# Patient Record
Sex: Male | Born: 1970 | Race: Asian | Hispanic: No | Marital: Married | Smoking: Never smoker
Health system: Southern US, Community
[De-identification: ages and names within clinical notes are randomized; demographics above are authoritative.]

## PROBLEM LIST (undated history)

## (undated) DIAGNOSIS — I10 Essential (primary) hypertension: Secondary | ICD-10-CM

## (undated) HISTORY — PX: NO PAST SURGERIES: SHX2092

---

## 2004-02-19 ENCOUNTER — Emergency Department (HOSPITAL_COMMUNITY): Admission: EM | Admit: 2004-02-19 | Discharge: 2004-02-19 | Payer: Self-pay | Admitting: Emergency Medicine

## 2005-05-22 ENCOUNTER — Emergency Department (HOSPITAL_COMMUNITY): Admission: EM | Admit: 2005-05-22 | Discharge: 2005-05-22 | Payer: Self-pay | Admitting: Ophthalmology

## 2010-12-21 ENCOUNTER — Ambulatory Visit (INDEPENDENT_AMBULATORY_CARE_PROVIDER_SITE_OTHER): Payer: BC Managed Care – PPO | Admitting: Medical

## 2010-12-21 ENCOUNTER — Encounter: Payer: Self-pay | Admitting: Medical

## 2010-12-21 DIAGNOSIS — Z7189 Other specified counseling: Secondary | ICD-10-CM

## 2010-12-21 DIAGNOSIS — Z0000 Encounter for general adult medical examination without abnormal findings: Secondary | ICD-10-CM

## 2010-12-21 MED ORDER — BENZONATATE 200 MG PO CAPS: 200.0000 mg | ORAL_CAPSULE | Freq: Three times a day (TID) | ORAL | Status: DC | PRN

## 2010-12-21 MED ORDER — AZITHROMYCIN 250 MG PO TABS: ORAL_TABLET | ORAL | Status: DC

## 2010-12-21 NOTE — Patient Instructions (Signed)
I examined you today.    I would recommend you eat healthy, exercise regularly and try and lose 10 pounds in the next 3 months.  You are a little overweight.  You stated today that your employer has a nurse that checks labs and checks your health regularly.  Please ask the nurse to get [place] a copy of your latest lab work.  If you have not had the following lab work in the past 6 months, then return here for labs.  We normally check the following labs during a physical:  Comprehensive metabolic panel           (CMET) Complete blood count                            (CBC) Lipid panel                                              (Lipid) Thyroid                                                   (TSH)  Your urinalysis was normal today.    Preventative Care for Adults, Male       REGULAR HEALTH EXAMS:  A routine yearly physical is a good way to check in with your primary care provider about your health and preventive screening. It is also an opportunity to share updates about your health and any concerns you have, and receive a thorough all-over exam.   Most health insurance companies pay for at least some preventative services.  Check with your health plan for specific coverages.  WHAT PREVENTATIVE SERVICES DO MEN NEED?  Adult men should have their weight and blood pressure checked regularly.   Men age 40 and older should have their cholesterol levels checked regularly.  Beginning at age 40 and continuing to age 40, men should be screened for colorectal cancer.  Certain people should may need continued testing until age 40.  Other cancer screening may include exams for testicular and prostate cancer.  Updating vaccinations is part of preventative care.  Vaccinations help protect against diseases such as the flu.  Lab tests are generally done as part of preventative care to screen for anemia and blood disorders, to screen for problems with the kidneys and liver, to screen for bladder  problems, to check blood sugar, and to check your cholesterol level.  Preventative services generally include counseling about diet, exercise, avoiding tobacco, drugs, excessive alcohol consumption, and sexually transmitted infections.    GENERAL RECOMMENDATIONS FOR GOOD HEALTH:  Healthy diet:  Eat a variety of foods, including fruit, vegetables, animal or vegetable protein, such as meat, fish, chicken, and eggs, or beans, lentils, tofu, and grains, such as rice.  Drink plenty of water daily.  Decrease saturated fat in the diet, avoid lots of red meat, processed foods, sweets, fast foods, and fried foods.  Exercise:  Aerobic exercise helps maintain good heart health. At least 30-40 minutes of moderate-intensity exercise is recommended. For example, a brisk walk that increases your heart rate and breathing. This should be done on most days of the week.   Find a type of exercise or a  variety of exercises that you enjoy so that it becomes a part of your daily life.  Examples are running, walking, swimming, water aerobics, and biking.  For motivation and support, explore group exercise such as aerobic class, spin class, Zumba, Yoga,or  martial arts, etc.    Set exercise goals for yourself, such as a certain weight goal, walk or run in a race such as a 5k walk/run.  Speak to your primary care provider about exercise goals.  Disease prevention:  If you smoke or chew tobacco, find out from your caregiver how to quit. It can literally save your life, no matter how long you have been a tobacco user. If you do not use tobacco, never begin.   Maintain a healthy diet and normal weight. Increased weight leads to problems with blood pressure and diabetes.   The Body Mass Index or BMI is a way of measuring how much of your body is fat. Having a BMI above 27 increases the risk of heart disease, diabetes, hypertension, stroke and other problems related to obesity. Your caregiver can help determine your  BMI and based on it develop an exercise and dietary program to help you achieve or maintain this important measurement at a healthful level.  High blood pressure causes heart and blood vessel problems.  Persistent high blood pressure should be treated with medicine if weight loss and exercise do not work.   Fat and cholesterol leaves deposits in your arteries that can block them. This causes heart disease and vessel disease elsewhere in your body.  If your cholesterol is found to be high, or if you have heart disease or certain other medical conditions, then you may need to have your cholesterol monitored frequently and be treated with medication.   Ask if you should have a stress test if your history suggests this. A stress test is a test done on a treadmill that looks for heart disease. This test can find disease prior to there being a problem.  Avoid drinking alcohol in excess (more than two drinks per day).  Avoid use of street drugs. Do not share needles with anyone. Ask for professional help if you need assistance or instructions on stopping the use of alcohol, cigarettes, and/or drugs.  Brush your teeth twice a day with fluoride toothpaste, and floss once a day. Good oral hygiene prevents tooth decay and gum disease. The problems can be painful, unattractive, and can cause other health problems. Visit your dentist for a routine oral and dental check up and preventive care every 6-12 months.   Look at your skin regularly.  Use a mirror to look at your back. Notify your caregivers of changes in moles, especially if there are changes in shapes, colors, a size larger than a pencil eraser, an irregular border, or development of new moles.  Safety:  Use seatbelts 100% of the time, whether driving or as a passenger.  Use safety devices such as hearing protection if you work in environments with loud noise or significant background noise.  Use safety glasses when doing any work that could send debris  in to the eyes.  Use a helmet if you ride a bike or motorcycle.  Use appropriate safety gear for contact sports.  Talk to your caregiver about gun safety.  Use sunscreen with a SPF (or skin protection factor) of 15 or greater.  Lighter skinned people are at a greater risk of skin cancer. Don't forget to also wear sunglasses in order to protect your  eyes from too much damaging sunlight. Damaging sunlight can accelerate cataract formation.   Practice safe sex. Use condoms. Condoms are used for birth control and to help reduce the spread of sexually transmitted infections (or STIs).  Some of the STIs are gonorrhea (the clap), chlamydia, syphilis, trichomonas, herpes, HPV (human papilloma virus) and HIV (human immunodeficiency virus) which causes AIDS. The herpes, HIV and HPV are viral illnesses that have no cure. These can result in disability, cancer and death.   Keep carbon monoxide and smoke detectors in your home functioning at all times. Change the batteries every 6 months or use a model that plugs into the wall.   Vaccinations:  Stay up to date with your tetanus shots and other required immunizations. You should have a booster for tetanus every 10 years. Be sure to get your flu shot every year, since 5%-20% of the [place] population comes down with the flu. The flu vaccine changes each year, so being vaccinated once is not enough. Get your shot in the fall, before the flu season peaks.   Other vaccines to consider:  Pneumococcal vaccine to protect against certain types of pneumonia.  This is normally recommended for adults age 89 or older.  However, adults younger than 40 years old with certain underlying conditions such as diabetes, heart or lung disease should also receive the vaccine.  Shingles vaccine to protect against Varicella Zoster if you are older than age 23, or younger than 40 years old with certain underlying illness.  Hepatitis A vaccine to protect against a form of infection of the  liver by a virus acquired from food.  Hepatitis B vaccine to protect against a form of infection of the liver by a virus acquired from blood or body fluids, particularly if you work in health care.  If you plan to travel internationally, check with your local health department for specific vaccination recommendations.  Cancer Screening:  Most routine colon cancer screening begins at the age of 51. On a yearly basis, doctors may provide special easy to use take-home tests to check for hidden blood in the stool. Sigmoidoscopy or colonoscopy can detect the earliest forms of colon cancer and is life saving. These tests use a small camera at the end of a tube to directly examine the colon. Speak to your caregiver about this at age 17, when routine screening begins (and is repeated every 5 years unless early forms of pre-cancerous polyps or small growths are found).   At the age of 86 men usually start screening for prostate cancer every year. Screening may begin at a younger age for those with higher risk. Those at higher risk include African-Americans or having a family history of prostate cancer. There are two types of tests for prostate cancer:   Prostate-specific antigen (PSA) testing. Recent studies raise questions about prostate cancer using PSA and you should discuss this with your caregiver.   Digital rectal exam (in which your doctor's lubricated and gloved finger feels for enlargement of the prostate through the anus).   Screening for testicular cancer.  Do a monthly exam of your testicles. Gently roll each testicle between your thumb and fingers, feeling for any abnormal lumps. The best time to do this is after a hot shower or bath when the tissues are looser. Notify your caregivers of any lumps, tenderness or changes in size or shape immediately.

## 2010-12-21 NOTE — Progress Notes (Signed)
Addended by: Jac Canavan on: 12/21/2010 07:00 PM   Modules accepted: Orders

## 2010-12-21 NOTE — Progress Notes (Signed)
Subjective:   HPI  Casey [name] is a 40 y.o. male who presents as a new patient for a complete physical.  His daughter is here with him to help translate.  He is originally from [place], but has lived here 17 years.  He speaks relatively good english.  He has not had a regular family doctor, and other than rare occasional acute issue, hasn't been to the doctor in years.     He is here because his employer requires all their employees to have a family physician and yearly physical.  He notes that the employer occupational nurse checks him regularly, presumably for BP, but he notes labs done through work routinely, within the past 8mo.   He is not fasting today and declines labs today.    In general he feels healthy without c/o.    Reviewed their medical, surgical, family, social, medication, and allergy history and updated chart as appropriate.  History reviewed. No pertinent past medical history.  History reviewed. No pertinent past surgical history.  History reviewed. No pertinent family history.  History   Social History  . Marital Status: Single    Spouse Name: N/A    Number of Children: N/A  . Years of Education: N/A   Occupational History  . Not on file.   Social History Main Topics  . Smoking status: Never Smoker   . Smokeless tobacco: Never Used  . Alcohol Use: No  . Drug Use: No  . 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 Not on file   Social History Narrative  . No narrative on file    No current outpatient prescriptions on file prior to visit.    No Known Allergies   Review of Systems Constitutional: denies fever, chills, sweats, unexpected weight change, anorexia, fatigue Allergy: negative; denies recent sneezing, itching, congestion Dermatology: denies changing moles, rash, lumps, new worrisome lesions ENT: no runny nose, ear pain, sore  throat, hoarseness, sinus pain, teeth pain, tinnitus, hearing loss, epistaxis Cardiology: denies chest pain, palpitations, edema, orthopnea, paroxysmal nocturnal dyspnea Respiratory: denies cough, shortness of breath, dyspnea on exertion, wheezing, hemoptysis Gastroenterology: denies abdominal pain, nausea, vomiting, diarrhea, constipation, blood in stool, changes in bowel movement, dysphagia Hematology: denies bleeding or bruising problems Musculoskeletal: denies arthralgias, myalgias, joint swelling, back pain, neck pain, cramping, gait changes Ophthalmology: denies vision changes, eye redness, itching, discharge Urology: denies dysuria, difficulty urinating, hematuria, urinary frequency, urgency, incontinence Neurology: +occasional headache; denies weakness, tingling, numbness, speech abnormality, memory loss, falls, dizziness Psychology: denies depressed mood, agitation, sleep problems     Objective:   Physical Exam  Filed Vitals:   12/21/10 1452  BP: 120/80  Pulse: 80  Temp: 97.7 F (36.5 C)  Resp: 20    General appearance: alert, no distress, WD/WN, [place] male, overweight Skin: unremarkable, no worrisome lesions HEENT: normocephalic, conjunctiva/corneas normal, sclerae anicteric, PERRLA, EOMi, nares patent, no discharge or erythema, pharynx normal Oral cavity: MMM, tongue normal, teeth with lots of stain, but no obvious decay, relatively good repair Neck: supple, no lymphadenopathy, no thyromegaly, no masses, normal ROM, no bruits Chest: non tender, normal shape and expansion Heart: RRR, normal S1, S2, no murmurs Lungs: CTA bilaterally, no wheezes, rhonchi, or rales Abdomen: +bs, soft, non tender, non distended, no masses, no hepatomegaly, no splenomegaly, no bruits Back: non tender, normal ROM, no scoliosis Musculoskeletal: upper extremities  non tender, no obvious deformity, normal ROM throughout, lower extremities non tender, no obvious deformity, normal ROM  throughout Extremities: no edema, no cyanosis, no clubbing Pulses: 2+ symmetric, upper and lower extremities, normal cap refill Neurological: alert, oriented x 3, CN2-12 intact, strength normal upper extremities and lower extremities, sensation normal throughout, DTRs 2+ throughout, no cerebellar signs, gait normal Psychiatric: normal affect, behavior normal, pleasant  GU: normal male external genitalia, uncircumcised, no mass, no hernia, nontender   Assessment :    Encounter Diagnoses  Name Primary?  . General medical examination Yes  . Counseling on health promotion and disease prevention       Plan:    Physical exam - discussed healthy lifestyle, diet, exercise, preventative care, vaccinations, and addressed their concerns.    Advised screening labs, flu shot, Tdap update.  He declines as he thinks these have all been done through work.  I advised weight loss, healthy diet.  Advised he bring me copies of labs and vaccines through employer.  I signed his form, but advised that a physical includes screening labs as well which were not done today.

## 2010-12-21 NOTE — Progress Notes (Signed)
Addended by: Jac Canavan on: 12/21/2010 06:58 PM   Modules accepted: Orders

## 2010-12-25 LAB — POCT URINALYSIS DIPSTICK
Blood, UA: NEGATIVE
Protein, UA: NEGATIVE
Spec Grav, UA: 1.01
Urobilinogen, UA: NEGATIVE

## 2011-01-10 ENCOUNTER — Telehealth: Payer: Self-pay | Admitting: Medical

## 2011-01-10 NOTE — Telephone Encounter (Signed)
Message copied by Janeice Robinson on Thu Jan 10, 2011  2:49 PM ------      Message from: Aleen Campi, DAVID S      Created: Thu Jan 10, 2011  2:09 PM       I reviewed labs that were sent over.   His triglycerides (fats) in the blood were high, but otherwise ALL labs ok.  His blood counts, liver, kidney, lytes such as sodium and potassium, thyroid, bad cholesterol ALL normal.               Regarding his fats, I recommend he exercise daily such as walking, eat healthy low fat diet, cut down on total calorie intake, and try and lose 10-15 pounds in the next 102mo.  In general - avoid soda, sweet tea, limit sweets (cakes, pies, ice cream), and avoid fast food.             I also want him to see a dentist for a hygiene visit.  I recommend Dr. Yancey Flemings [phone].  He can call them for pricing, etc.             Lets see him back in 6 months to recheck on his weight, diet.

## 2011-01-10 NOTE — Telephone Encounter (Signed)
I spoke with the patients daughter (because patient does not speak english) and notified her of S. Tysingers message and advised her of the name of the dentist and to f/u in 6 months. cls

## 2012-01-15 ENCOUNTER — Encounter: Payer: BC Managed Care – PPO | Admitting: Medical

## 2012-01-31 ENCOUNTER — Ambulatory Visit (INDEPENDENT_AMBULATORY_CARE_PROVIDER_SITE_OTHER): Payer: BC Managed Care – PPO | Admitting: Medical

## 2012-01-31 ENCOUNTER — Encounter: Payer: Self-pay | Admitting: Medical

## 2012-01-31 VITALS — BP 112/80 | HR 88 | Temp 97.5°F | Resp 16 | Ht 64.5 in | Wt 196.0 lb

## 2012-01-31 DIAGNOSIS — Z0000 Encounter for general adult medical examination without abnormal findings: Secondary | ICD-10-CM

## 2012-01-31 LAB — CBC WITH DIFFERENTIAL/PLATELET
Eosinophils Absolute: 0.2 10*3/uL (ref 0.0–0.7)
Hemoglobin: 16.8 g/dL (ref 13.0–17.0)
MCH: 29 pg (ref 26.0–34.0)
MCHC: 34.5 g/dL (ref 30.0–36.0)
Neutro Abs: 4 10*3/uL (ref 1.7–7.7)
Neutrophils Relative %: 56 % (ref 43–77)
Platelets: 174 10*3/uL (ref 150–400)

## 2012-01-31 NOTE — Progress Notes (Signed)
Subjective:   HPI  Casey [name] is a 41 y.o. male who presents for a yearly physical.   Last visit here a year ago for the same.  He is originally from [place], but has lived here 17 years.  He speaks relatively good english.  He is here because his employer requires all their employees to have a family physician and yearly physical.  He notes that the employer occupational nurse checks him regularly, presumably for BP.  In general he feels healthy without c/o.    Reviewed their medical, surgical, family, social, medication, and allergy history and updated chart as appropriate.  No past medical history on file.  No past surgical history on file.  No family history on file.  History   Social History  . Marital Status: Single    Spouse Name: N/A    Number of Children: N/A  . Years of Education: N/A   Occupational History  . Not on file.   Social History Main Topics  . Smoking status: Never Smoker   . Smokeless tobacco: Never Used  . Alcohol Use: No  . Drug Use: No  . 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 Not on file   Social History Narrative  . No narrative on file    No current outpatient prescriptions on file prior to visit.    No Known Allergies  Review of Systems Constitutional: -fever, -chills, -sweats, -unexpected weight change, -anorexia, -fatigue Allergy: -sneezing, -itching, -congestion Dermatology: denies changing moles, rash, lumps, new worrisome lesions ENT: -runny nose, +ear pain, -sore throat, -hoarseness, -sinus pain, -teeth pain, -tinnitus, -hearing loss, -epistaxis Cardiology:  -chest pain, -palpitations, -edema, -orthopnea, -paroxysmal nocturnal dyspnea Respiratory: -cough, -shortness of breath, -dyspnea on exertion, -wheezing, -hemoptysis Gastroenterology: -abdominal pain, -nausea, -vomiting, -diarrhea, -constipation, -blood in  stool, -changes in bowel movement, -dysphagia Hematology: -bleeding or bruising problems Musculoskeletal: -arthralgias, -myalgias, -joint swelling, -back pain, -neck pain, -cramping, -gait changes Ophthalmology: -vision changes, -eye redness, -itching, -discharge Urology: -dysuria, -difficulty urinating, -hematuria, -urinary frequency, -urgency, incontinence Neurology: +headache, -weakness, -tingling, -numbness, -speech abnormality, -memory loss, -falls, -dizziness Psychology:  -depressed mood, -agitation, -sleep problems         Objective:   Physical Exam  Filed Vitals:   01/31/12 1021  BP: 112/80  Pulse: 88  Temp: 97.5 F (36.4 C)  Resp: 16    General appearance: alert, no distress, WD/WN, [place] male, overweight Skin: unremarkable, no worrisome lesions HEENT: normocephalic, conjunctiva/corneas normal, sclerae anicteric, PERRLA, EOMi, nares patent, no discharge or erythema, pharynx normal Oral cavity: MMM, tongue normal, teeth with lots of stain, but no obvious decay, relatively good repair Neck: supple, no lymphadenopathy, no thyromegaly, no masses, normal ROM, no bruits Chest: non tender, normal shape and expansion Heart: RRR, normal S1, S2, no murmurs Lungs: CTA bilaterally, no wheezes, rhonchi, or rales Abdomen: +bs, soft, non tender, non distended, no masses, no hepatomegaly, no splenomegaly, no bruits Back: non tender, normal ROM, no scoliosis Musculoskeletal: upper extremities non tender, no obvious deformity, normal ROM throughout, lower extremities non tender, no obvious deformity, normal ROM throughout Extremities: no edema, no cyanosis, no clubbing Pulses: 2+ symmetric, upper and lower extremities, normal cap refill Neurological: alert, oriented x 3, CN2-12 intact, strength normal upper extremities and lower extremities, sensation normal throughout, DTRs 2+ throughout, no cerebellar signs, gait normal Psychiatric: normal affect, behavior normal, pleasant  GU:  normal male external genitalia, uncircumcised, no mass, no hernia, nontender   Assessment :    Encounter Diagnosis  Name Primary?  . Routine general medical examination at a health care facility Yes     Plan:    Physical exam - discussed healthy lifestyle, diet, exercise, preventative care, vaccinations, and addressed their concerns.  He recently received flu shot at work. Fasting labs today.  Physical form for work signed.

## 2012-01-31 NOTE — Patient Instructions (Signed)
Preventative Care for Adults, Male       REGULAR HEALTH EXAMS:  A routine yearly physical is a good way to check in with your primary care provider about your health and preventive screening. It is also an opportunity to share updates about your health and any concerns you have, and receive a thorough all-over exam.   Most health insurance companies pay for at least some preventative services.  Check with your health plan for specific coverages.  WHAT PREVENTATIVE SERVICES DO MEN NEED?  Adult men should have their weight and blood pressure checked regularly.   Men age 35 and older should have their cholesterol levels checked regularly.  Beginning at age 50 and continuing to age 75, men should be screened for colorectal cancer.  Certain people should may need continued testing until age 85.  Other cancer screening may include exams for testicular and prostate cancer.  Updating vaccinations is part of preventative care.  Vaccinations help protect against diseases such as the flu.  Lab tests are generally done as part of preventative care to screen for anemia and blood disorders, to screen for problems with the kidneys and liver, to screen for bladder problems, to check blood sugar, and to check your cholesterol level.  Preventative services generally include counseling about diet, exercise, avoiding tobacco, drugs, excessive alcohol consumption, and sexually transmitted infections.    GENERAL RECOMMENDATIONS FOR GOOD HEALTH:  Healthy diet:  Eat a variety of foods, including fruit, vegetables, animal or vegetable protein, such as meat, fish, chicken, and eggs, or beans, lentils, tofu, and grains, such as rice.  Drink plenty of water daily.  Decrease saturated fat in the diet, avoid lots of red meat, processed foods, sweets, fast foods, and fried foods.  Exercise:  Aerobic exercise helps maintain good heart health. At least 30-40 minutes of moderate-intensity exercise is recommended.  For example, a brisk walk that increases your heart rate and breathing. This should be done on most days of the week.   Find a type of exercise or a variety of exercises that you enjoy so that it becomes a part of your daily life.  Examples are running, walking, swimming, water aerobics, and biking.  For motivation and support, explore group exercise such as aerobic class, spin class, Zumba, Yoga,or  martial arts, etc.    Set exercise goals for yourself, such as a certain weight goal, walk or run in a race such as a 5k walk/run.  Speak to your primary care provider about exercise goals.  Disease prevention:  If you smoke or chew tobacco, find out from your caregiver how to quit. It can literally save your life, no matter how long you have been a tobacco user. If you do not use tobacco, never begin.   Maintain a healthy diet and normal weight. Increased weight leads to problems with blood pressure and diabetes.   The Body Mass Index or BMI is a way of measuring how much of your body is fat. Having a BMI above 27 increases the risk of heart disease, diabetes, hypertension, stroke and other problems related to obesity. Your caregiver can help determine your BMI and based on it develop an exercise and dietary program to help you achieve or maintain this important measurement at a healthful level.  High blood pressure causes heart and blood vessel problems.  Persistent high blood pressure should be treated with medicine if weight loss and exercise do not work.   Fat and cholesterol leaves deposits in your arteries   that can block them. This causes heart disease and vessel disease elsewhere in your body.  If your cholesterol is found to be high, or if you have heart disease or certain other medical conditions, then you may need to have your cholesterol monitored frequently and be treated with medication.   Ask if you should have a stress test if your history suggests this. A stress test is a test done on  a treadmill that looks for heart disease. This test can find disease prior to there being a problem.  Avoid drinking alcohol in excess (more than two drinks per day).  Avoid use of street drugs. Do not share needles with anyone. Ask for professional help if you need assistance or instructions on stopping the use of alcohol, cigarettes, and/or drugs.  Brush your teeth twice a day with fluoride toothpaste, and floss once a day. Good oral hygiene prevents tooth decay and gum disease. The problems can be painful, unattractive, and can cause other health problems. Visit your dentist for a routine oral and dental check up and preventive care every 6-12 months.   Look at your skin regularly.  Use a mirror to look at your back. Notify your caregivers of changes in moles, especially if there are changes in shapes, colors, a size larger than a pencil eraser, an irregular border, or development of new moles.  Safety:  Use seatbelts 100% of the time, whether driving or as a passenger.  Use safety devices such as hearing protection if you work in environments with loud noise or significant background noise.  Use safety glasses when doing any work that could send debris in to the eyes.  Use a helmet if you ride a bike or motorcycle.  Use appropriate safety gear for contact sports.  Talk to your caregiver about gun safety.  Use sunscreen with a SPF (or skin protection factor) of 15 or greater.  Lighter skinned people are at a greater risk of skin cancer. Don't forget to also wear sunglasses in order to protect your eyes from too much damaging sunlight. Damaging sunlight can accelerate cataract formation.   Practice safe sex. Use condoms. Condoms are used for birth control and to help reduce the spread of sexually transmitted infections (or STIs).  Some of the STIs are gonorrhea (the clap), chlamydia, syphilis, trichomonas, herpes, HPV (human papilloma virus) and HIV (human immunodeficiency virus) which causes AIDS.  The herpes, HIV and HPV are viral illnesses that have no cure. These can result in disability, cancer and death.   Keep carbon monoxide and smoke detectors in your home functioning at all times. Change the batteries every 6 months or use a model that plugs into the wall.   Vaccinations:  Stay up to date with your tetanus shots and other required immunizations. You should have a booster for tetanus every 10 years. Be sure to get your flu shot every year, since 5%-20% of the [place] population comes down with the flu. The flu vaccine changes each year, so being vaccinated once is not enough. Get your shot in the fall, before the flu season peaks.   Other vaccines to consider:  Pneumococcal vaccine to protect against certain types of pneumonia.  This is normally recommended for adults age 65 or older.  However, adults younger than 41 years old with certain underlying conditions such as diabetes, heart or lung disease should also receive the vaccine.  Shingles vaccine to protect against Varicella Zoster if you are older than age 60, or younger   than 41 years old with certain underlying illness.  Hepatitis A vaccine to protect against a form of infection of the liver by a virus acquired from food.  Hepatitis B vaccine to protect against a form of infection of the liver by a virus acquired from blood or body fluids, particularly if you work in health care.  If you plan to travel internationally, check with your local health department for specific vaccination recommendations.  Cancer Screening:  Most routine colon cancer screening begins at the age of 50. On a yearly basis, doctors may provide special easy to use take-home tests to check for hidden blood in the stool. Sigmoidoscopy or colonoscopy can detect the earliest forms of colon cancer and is life saving. These tests use a small camera at the end of a tube to directly examine the colon. Speak to your caregiver about this at age 50, when routine  screening begins (and is repeated every 5 years unless early forms of pre-cancerous polyps or small growths are found).   At the age of 50 men usually start screening for prostate cancer every year. Screening may begin at a younger age for those with higher risk. Those at higher risk include African-Americans or having a family history of prostate cancer. There are two types of tests for prostate cancer:   Prostate-specific antigen (PSA) testing. Recent studies raise questions about prostate cancer using PSA and you should discuss this with your caregiver.   Digital rectal exam (in which your doctor's lubricated and gloved finger feels for enlargement of the prostate through the anus).   Screening for testicular cancer.  Do a monthly exam of your testicles. Gently roll each testicle between your thumb and fingers, feeling for any abnormal lumps. The best time to do this is after a hot shower or bath when the tissues are looser. Notify your caregivers of any lumps, tenderness or changes in size or shape immediately.     

## 2012-02-01 LAB — COMPREHENSIVE METABOLIC PANEL
ALT: 32 U/L (ref 0–53)
AST: 22 U/L (ref 0–37)
Calcium: 9.5 mg/dL (ref 8.4–10.5)
Chloride: 100 mEq/L (ref 96–112)
Creat: 0.93 mg/dL (ref 0.50–1.35)
Total Bilirubin: 0.9 mg/dL (ref 0.3–1.2)

## 2012-02-01 LAB — LIPID PANEL
Cholesterol: 197 mg/dL (ref 0–200)
HDL: 37 mg/dL — ABNORMAL LOW (ref >39)
Total CHOL/HDL Ratio: 5.3 Ratio

## 2013-02-08 ENCOUNTER — Encounter (HOSPITAL_COMMUNITY): Payer: Self-pay | Admitting: Emergency Medicine

## 2013-02-08 ENCOUNTER — Emergency Department (HOSPITAL_COMMUNITY)
Admission: EM | Admit: 2013-02-08 | Discharge: 2013-02-08 | Disposition: A | Discharge status: Home / Self Care | Payer: BC Managed Care – PPO | Source: Home / Self Care | Attending: Family Medicine | Admitting: Family Medicine

## 2013-02-08 DIAGNOSIS — K649 Unspecified hemorrhoids: Secondary | ICD-10-CM

## 2013-02-08 LAB — POCT I-STAT, CHEM 8
Chloride: 103 mEq/L (ref 96–112)
Creatinine, Ser: 1.2 mg/dL (ref 0.50–1.35)
Glucose, Bld: 112 mg/dL — ABNORMAL HIGH (ref 70–99)
Hemoglobin: 17.3 g/dL — ABNORMAL HIGH (ref 13.0–17.0)

## 2013-02-08 LAB — RPR: RPR Ser Ql: NONREACTIVE

## 2013-02-08 MED ORDER — HYDROCORTISONE 2.5 % RE CREA: TOPICAL_CREAM | RECTAL | Status: DC

## 2013-02-08 NOTE — ED Provider Notes (Signed)
CSN: [id number]     Arrival date & time 02/08/13  1126 History   First MD Initiated Contact with Patient 02/08/13 1308     Chief Complaint  Patient presents with  . Rectal Bleeding   (Consider location/radiation/quality/duration/timing/severity/associated sxs/prior Treatment) Patient is a 42 y.o. male presenting with hematochezia. The history is provided by the patient. No language interpreter was used.  Rectal Bleeding Quality:  Bright red Timing:  Constant Progression:  Worsening Context: hemorrhoids and rectal pain   Pain details:    Severity:  Mild   Timing:  Constant   Progression:  Worsening Similar prior episodes: no   Relieved by:  Nothing Pt also request a syphillus test.  Pt reports exposure 2 years ago  History reviewed. No pertinent past medical history. History reviewed. No pertinent past surgical history. History reviewed. No pertinent family history. History  Substance Use Topics  . Smoking status: Never Smoker   . Smokeless tobacco: Never Used  . Alcohol Use: No    Review of Systems  Gastrointestinal: Positive for hematochezia.    Allergies  Review of patient's allergies indicates no known allergies.  Home Medications  No current outpatient prescriptions on file. BP 121/82  Pulse 71  Temp(Src) 98.5 F (36.9 C) (Oral)  Resp 16  SpO2 97% Physical Exam  Constitutional: He is oriented to person, place, and time. He appears well-developed and well-nourished.  HENT:  Head: Normocephalic.  Eyes: Conjunctivae are normal. Pupils are equal, round, and reactive to light.  Neck: Normal range of motion.  Cardiovascular: Normal rate and regular rhythm.   Pulmonary/Chest: Effort normal and breath sounds normal.  Abdominal: Soft.  Musculoskeletal: Normal range of motion.  Neurological: He is alert and oriented to person, place, and time. He has normal reflexes.  Skin: Skin is warm.  Psychiatric: He has a normal mood and affect.    ED Course  Procedures  (including critical care time) Labs Review Labs Reviewed  POCT I-STAT, CHEM 8 - Abnormal; Notable for the following:    Glucose, Bld 112 (*)    Hemoglobin 17.3 (*)    All other components within normal limits  RPR   Imaging Review No results found.  EKG Interpretation     Ventricular Rate:    PR Interval:    QRS Duration:   QT Interval:    QTC Calculation:   R Axis:     Text Interpretation:              MDM   1. Hemorrhoids    RPR pending      Elson Areas, PA-C 02/08/13 1422  Lonia Skinner [place], PA-C 02/08/13 1423  Lonia Skinner [place], [place] 02/08/13 1424

## 2013-02-08 NOTE — ED Notes (Signed)
C/o rectal bleeding Saturday and again today , both times w a firm /hard stool; information via daughter, who is acting as Nurse, learning disability; pt also using motrin for HA and "hot sensation " in back

## 2013-02-10 NOTE — ED Provider Notes (Signed)
Medical screening examination/treatment/procedure(s) were performed by resident physician or non-physician practitioner and as supervising physician I was immediately available for consultation/collaboration.   Dysen Edmondson DOUGLAS MD.   Khary Schaben D Jakobi [place], MD 02/10/13 2102 

## 2014-09-23 ENCOUNTER — Emergency Department (HOSPITAL_COMMUNITY): Payer: 59

## 2014-09-23 ENCOUNTER — Emergency Department (HOSPITAL_COMMUNITY)
Admission: EM | Admit: 2014-09-23 | Discharge: 2014-09-23 | Disposition: A | Discharge status: Home / Self Care | Payer: 59 | Attending: Emergency Medicine | Admitting: Emergency Medicine

## 2014-09-23 ENCOUNTER — Encounter (HOSPITAL_COMMUNITY): Payer: Self-pay | Admitting: Emergency Medicine

## 2014-09-23 ENCOUNTER — Other Ambulatory Visit: Payer: Self-pay

## 2014-09-23 DIAGNOSIS — R51 Headache: Secondary | ICD-10-CM | POA: Insufficient documentation

## 2014-09-23 DIAGNOSIS — R42 Dizziness and giddiness: Secondary | ICD-10-CM | POA: Diagnosis not present

## 2014-09-23 DIAGNOSIS — Z7952 Long term (current) use of systemic steroids: Secondary | ICD-10-CM | POA: Diagnosis not present

## 2014-09-23 DIAGNOSIS — I10 Essential (primary) hypertension: Secondary | ICD-10-CM | POA: Diagnosis not present

## 2014-09-23 DIAGNOSIS — R200 Anesthesia of skin: Secondary | ICD-10-CM | POA: Diagnosis not present

## 2014-09-23 DIAGNOSIS — R519 Headache, unspecified: Secondary | ICD-10-CM

## 2014-09-23 DIAGNOSIS — R112 Nausea with vomiting, unspecified: Secondary | ICD-10-CM | POA: Diagnosis not present

## 2014-09-23 DIAGNOSIS — Z79899 Other long term (current) drug therapy: Secondary | ICD-10-CM | POA: Diagnosis not present

## 2014-09-23 HISTORY — DX: Essential (primary) hypertension: I10

## 2014-09-23 LAB — I-STAT CHEM 8, ED
BUN: 13 mg/dL (ref 6–20)
CALCIUM ION: 1.19 mmol/L (ref 1.12–1.23)
Chloride: 103 mmol/L (ref 101–111)
Creatinine, Ser: 1.1 mg/dL (ref 0.61–1.24)
Glucose, Bld: 108 mg/dL — ABNORMAL HIGH (ref 65–99)
HCT: 48 % (ref 39.0–52.0)
Hemoglobin: 16.3 g/dL (ref 13.0–17.0)
Potassium: 3.3 mmol/L — ABNORMAL LOW (ref 3.5–5.1)
Sodium: 140 mmol/L (ref 135–145)
TCO2: 23 mmol/L (ref 0–100)

## 2014-09-23 LAB — URINALYSIS, ROUTINE W REFLEX MICROSCOPIC
Bilirubin Urine: NEGATIVE
Glucose, UA: NEGATIVE mg/dL
HGB URINE DIPSTICK: NEGATIVE
Ketones, ur: NEGATIVE mg/dL
LEUKOCYTES UA: NEGATIVE
NITRITE: NEGATIVE
PH: 6.5 (ref 5.0–8.0)
Protein, ur: NEGATIVE mg/dL
Specific Gravity, Urine: 1.022 (ref 1.005–1.030)
UROBILINOGEN UA: 1 mg/dL (ref 0.0–1.0)

## 2014-09-23 LAB — CBC
HEMATOCRIT: 47.9 % (ref 39.0–52.0)
HEMOGLOBIN: 16.4 g/dL (ref 13.0–17.0)
MCH: 29.2 pg (ref 26.0–34.0)
MCHC: 34.2 g/dL (ref 30.0–36.0)
MCV: 85.2 fL (ref 78.0–100.0)
Platelets: 143 10*3/uL — ABNORMAL LOW (ref 150–400)
RBC: 5.62 MIL/uL (ref 4.22–5.81)
RDW: 13.3 % (ref 11.5–15.5)
WBC: 7.4 10*3/uL (ref 4.0–10.5)

## 2014-09-23 LAB — DIFFERENTIAL
BASOS ABS: 0 10*3/uL (ref 0.0–0.1)
BASOS PCT: 0 % (ref 0–1)
Eosinophils Absolute: 0.2 10*3/uL (ref 0.0–0.7)
Eosinophils Relative: 2 % (ref 0–5)
Lymphocytes Relative: 44 % (ref 12–46)
Lymphs Abs: 3.3 10*3/uL (ref 0.7–4.0)
Monocytes Absolute: 0.7 10*3/uL (ref 0.1–1.0)
Monocytes Relative: 10 % (ref 3–12)
Neutro Abs: 3.2 10*3/uL (ref 1.7–7.7)
Neutrophils Relative %: 44 % (ref 43–77)

## 2014-09-23 LAB — COMPREHENSIVE METABOLIC PANEL
ALT: 44 U/L (ref 17–63)
AST: 29 U/L (ref 15–41)
Albumin: 3.9 g/dL (ref 3.5–5.0)
Alkaline Phosphatase: 68 U/L (ref 38–126)
Anion gap: 9 (ref 5–15)
BUN: 11 mg/dL (ref 6–20)
CALCIUM: 8.9 mg/dL (ref 8.9–10.3)
CO2: 26 mmol/L (ref 22–32)
CREATININE: 1.02 mg/dL (ref 0.61–1.24)
Chloride: 103 mmol/L (ref 101–111)
GLUCOSE: 107 mg/dL — AB (ref 65–99)
Potassium: 3.3 mmol/L — ABNORMAL LOW (ref 3.5–5.1)
SODIUM: 138 mmol/L (ref 135–145)
TOTAL PROTEIN: 6.9 g/dL (ref 6.5–8.1)
Total Bilirubin: 0.6 mg/dL (ref 0.3–1.2)

## 2014-09-23 LAB — RAPID URINE DRUG SCREEN, HOSP PERFORMED
AMPHETAMINES: NOT DETECTED
Barbiturates: NOT DETECTED
Benzodiazepines: NOT DETECTED
Cocaine: NOT DETECTED
OPIATES: NOT DETECTED
Tetrahydrocannabinol: NOT DETECTED

## 2014-09-23 LAB — ETHANOL: Alcohol, Ethyl (B): <5 mg/dL (ref <5)

## 2014-09-23 LAB — PROTIME-INR
INR: 1.01 (ref 0.00–1.49)
Prothrombin Time: 13.5 seconds (ref 11.6–15.2)

## 2014-09-23 LAB — I-STAT TROPONIN, ED: Troponin i, poc: 0.01 ng/mL (ref 0.00–0.08)

## 2014-09-23 LAB — APTT: aPTT: 30 seconds (ref 24–37)

## 2014-09-23 MED ORDER — ONDANSETRON 4 MG PO TBDP: ORAL_TABLET | ORAL | Status: AC

## 2014-09-23 MED ORDER — MECLIZINE HCL 25 MG PO TABS
50.0000 mg | ORAL_TABLET | Freq: Once | ORAL | Status: AC
  Administered 2014-09-23: 50 mg via ORAL
  Filled 2014-09-23: qty 2

## 2014-09-23 MED ORDER — HYDROCODONE-ACETAMINOPHEN 5-325 MG PO TABS: 2.0000 | ORAL_TABLET | ORAL | Status: AC | PRN

## 2014-09-23 MED ORDER — MECLIZINE HCL 25 MG PO TABS: 25.0000 mg | ORAL_TABLET | Freq: Three times a day (TID) | ORAL | Status: AC | PRN

## 2014-09-23 MED ORDER — FENTANYL CITRATE (PF) 100 MCG/2ML IJ SOLN
50.0000 ug | Freq: Once | INTRAMUSCULAR | Status: AC
  Administered 2014-09-23: 50 ug via INTRAVENOUS
  Filled 2014-09-23: qty 2

## 2014-09-23 MED ORDER — ONDANSETRON HCL 4 MG/2ML IJ SOLN
4.0000 mg | Freq: Once | INTRAMUSCULAR | Status: AC
  Administered 2014-09-23: 4 mg via INTRAVENOUS
  Filled 2014-09-23: qty 2

## 2014-09-23 NOTE — ED Notes (Signed)
Per pt's family member, pt woke up this morning around 0300 with sudden onset of headache on the left side, dizziness and vomiting. Pt is also reporting decreased hearing and ringing in his left ear.

## 2014-09-23 NOTE — ED Notes (Signed)
Pt states that he does not want anything for pain.

## 2014-09-23 NOTE — ED Notes (Signed)
Pt ambulated in the hallway with ease. 

## 2014-09-23 NOTE — ED Provider Notes (Signed)
CSN: [id number]     Arrival date & time 09/23/14  4098 History   First MD Initiated Contact with Patient 09/23/14 0450     Chief Complaint  Patient presents with  . Headache  . Dizziness     (Consider location/radiation/quality/duration/timing/severity/associated sxs/prior Treatment) HPI Patient presents with acute onset headache at 3 AM waking him from sleep. He noticed decreased sensation to the left upper and lower extremity. He also experienced decreased hearing and ringing in his left ear. He's had dizziness with nausea and vomiting. Per daughter patient had similar episode several weeks ago. Past Medical History  Diagnosis Date  . Hypertension    History reviewed. No pertinent past surgical history. History reviewed. No pertinent family history. History  Substance Use Topics  . Smoking status: Never Smoker   . Smokeless tobacco: Never Used  . Alcohol Use: No    Review of Systems  Constitutional: Negative for fever and chills.  Eyes: Negative for visual disturbance.  Respiratory: Negative for shortness of breath.   Cardiovascular: Negative for chest pain.  Gastrointestinal: Positive for nausea and vomiting. Negative for abdominal pain.  Musculoskeletal: Negative for back pain, neck pain and neck stiffness.  Skin: Negative for rash and wound.  Neurological: Positive for dizziness, light-headedness, numbness and headaches. Negative for syncope and weakness.  All other systems reviewed and are negative.     Allergies  Shellfish allergy  Home Medications   Prior to Admission medications   Medication Sig Start Date End Date Taking? Authorizing Provider  HYDROcodone-acetaminophen (NORCO) 5-325 MG per tablet Take 2 tablets by mouth every 4 (four) hours as needed. 09/23/14   Loren Racer, MD  hydrocortisone (ANUSOL-HC) 2.5 % rectal cream Apply rectally 2 times daily 02/08/13   Elson Areas, PA-C  meclizine (ANTIVERT) 25 MG tablet Take 1 tablet (25 mg total) by mouth 3  (three) times daily as needed for dizziness or nausea. 09/23/14   Loren Racer, MD  ondansetron (ZOFRAN ODT) 4 MG disintegrating tablet  ODT q4 hours prn nausea/vomit 09/23/14   Loren Racer, MD   BP 121/83 mmHg  Pulse 67  Temp(Src) 97.8 F (36.6 C) (Oral)  Resp 15  SpO2 97% Physical Exam  Constitutional: He is oriented to person, place, and time. He appears well-developed and well-nourished. No distress.  HENT:  Head: Normocephalic and atraumatic.  Mouth/Throat: Oropharynx is clear and moist. No oropharyngeal exudate.  Eyes: EOM are normal. Pupils are equal, round, and reactive to light.  No apparent nystagmus  Neck: Normal range of motion. Neck supple.  No meningismus  Cardiovascular: Normal rate and regular rhythm.  Exam reveals no gallop and no friction rub.   No murmur heard. Pulmonary/Chest: Effort normal and breath sounds normal. No respiratory distress. He has no wheezes. He has no rales.  Abdominal: Soft. Bowel sounds are normal. He exhibits no distension and no mass. There is no tenderness. There is no rebound and no guarding.  Musculoskeletal: Normal range of motion. He exhibits no edema or tenderness.  Neurological: He is alert and oriented to person, place, and time.  5/5 motor in all extremities. No drift. Sensation appears to be fully intact. Bilateral finger to nose is normal  Skin: Skin is warm and dry. No rash noted. No erythema.  Psychiatric: He has a normal mood and affect. His behavior is normal.  Nursing note and vitals reviewed.   ED Course  Procedures (including critical care time) Labs Review Labs Reviewed  CBC - Abnormal; Notable for the following:  Platelets 143 (*)    All other components within normal limits  COMPREHENSIVE METABOLIC PANEL - Abnormal; Notable for the following:    Potassium 3.3 (*)    Glucose, Bld 107 (*)    All other components within normal limits  I-STAT CHEM 8, ED - Abnormal; Notable for the following:    Potassium 3.3  (*)    Glucose, Bld 108 (*)    All other components within normal limits  ETHANOL  PROTIME-INR  APTT  DIFFERENTIAL  URINE RAPID DRUG SCREEN (HOSP PERFORMED) NOT AT [facility]  URINALYSIS, ROUTINE W REFLEX MICROSCOPIC ([facility])  I-STAT TROPOININ, ED    Imaging Review Ct Head Wo Contrast  09/23/2014   CLINICAL DATA:  Awoke from sleep with sudden onset of headache and dizziness.  EXAM: CT HEAD WITHOUT CONTRAST  TECHNIQUE: Contiguous axial images were obtained from the base of the skull through the vertex without intravenous contrast.  COMPARISON:  Head CT 05/22/2005  FINDINGS: No intracranial hemorrhage, mass effect, or midline shift. No hydrocephalus. The basilar cisterns are patent. No evidence of territorial infarct. No intracranial fluid collection. Calvarium is intact. Scattered mucosal thickening of ethmoid air cells, included paranasal sinuses are otherwise clear. Persistent partial opacification of mastoid air cells, left greater than right, however improved from prior exam.  IMPRESSION: 1.  No acute intracranial abnormality. 2. Minimal paranasal sinus inflammatory change. Mild persistent opacification of mastoid air cells.   Electronically Signed   By: Rubye OaksMelanie  Ehinger M.D.   On: 09/23/2014 05:22   Mr Brain Wo Contrast  09/23/2014   CLINICAL DATA:  Initial evaluation for acute headache, dizziness.  EXAM: MRI HEAD WITHOUT CONTRAST  TECHNIQUE: Multiplanar, multiecho pulse sequences of the brain and surrounding structures were obtained without intravenous contrast.  COMPARISON:  Prior CT from earlier the same day.  FINDINGS: The CSF containing spaces are within normal limits for patient age. Few scattered T2/FLAIR hyperintense foci noted within the periventricular white matter, nonspecific, but within normal limits for patient age. No mass lesion, midline shift, or extra-axial fluid collection. Ventricles are normal in size without evidence of hydrocephalus.  No diffusion-weighted signal abnormality  is identified to suggest acute intracranial infarct. Gray-white matter differentiation is maintained. Normal flow voids are seen within the intracranial vasculature. No intracranial hemorrhage identified.  The cervicomedullary junction is normal. Pituitary gland is within normal limits. Pituitary stalk is midline. The globes and optic nerves demonstrate a normal appearance with normal signal intensity.  The bone marrow signal intensity is normal. Calvarium is intact. Visualized upper cervical spine is within normal limits.  Scalp soft tissues are unremarkable.  Mild scattered opacity present within the ethmoidal air cells. Scattered fluid present within the mastoid air cells bilaterally, left greater than right.  IMPRESSION: 1. Normal brain MRI with no acute intracranial process identified. 2. Small bilateral mastoid effusions, left greater than right.   Electronically Signed   By: Rise MuBenjamin  McClintock M.D.   On: 09/23/2014 07:19     EKG Interpretation None      MDM   Final diagnoses:  Acute nonintractable headache, unspecified headache type  Dizziness  Non-intractable vomiting with nausea, vomiting of unspecified type    Discussed with Dr. Amada JupiterKirkpatrick. He recommends MRI for further evaluation.  No further vomiting. Continues to have normal neurologic exam. Normal CT head and MRI brain. Will reconsult neurology for recommendations regarding disposition.  Discussed with Dr Roseanne RenoStewart. Advises d/c to f/u with neurology.  Loren Raceravid Karee Christopherson, MD 09/23/14 [phone]

## 2014-09-23 NOTE — ED Notes (Signed)
50 mcg fentnyl wasted in sink, witnessed by Elliot Gurneyavid Henson, RN

## 2014-09-23 NOTE — Discharge Instructions (Signed)
Casey [name] [name] ([employer]) Casey [name] [name] c ngh?a l b?n c?m th?y nh? b?n hay m?i th? xung quanh b?n ?ang di chuy?n khi th?c t? khng ph?i nh? v?y. Casey [name] [name] c th? nguy hi?m n?u n x?y ra khi b?n ?ang ? n?i lm vi?c, li xe ho?c th?c hi?n cc ho?t ??ng kh kh?n. Casey [name] [name] NHN Casey [name] [name] x?y ra khi c s? xung ??t c?a cc tn hi?u ???c g?i ln no t? h? th?ng th? gic v h? th?ng c?m gic trong c? th?. C nhi?u Casey [name] [name] nhn khc nhau gy Casey [name] [name], bao g?m:  Nhi?m trng, ??c bi?t l ? tai trong.  Ph?n ?ng x?u v?i [name] lo?i thu?c ho?c l?m d?ng r??u v thu?c men.  Cai ma ty ho?c r??u.  Thay ??i t? th? nhanh, Casey [name] h?n nh? n?m xu?ng ho?c l?n mnh trn gi??ng.  ?au n?a ??u.  L?u l??ng mu ln no gi?m.  p l?c trong no t?ng sau ch?n th??ng ??u, nhi?m trng, kh?i u ho?c ch?y mu. TRI?U Casey [name] B?n c th? c?m th?y c v? nh? l th? gi?i ?ang quay cu?ng ho?c b?n ?ang r?i xu?ng ??t. B?i v s? cn b?ng c?a b?n b? ??o l?n, Casey [name] [name] c th? khi?n b?n bu?n nn v nn m?a. B?n c th? c c? ??ng [name] ngoi  mu?n (gi?t c?u [name]). CH?N ?ON Casey [name] [name] th??ng ???c ch?n ?on b?ng cch khm th?c th?. N?u khng xc ??nh ???c Casey [name] [name] nhn gy Casey [name] [name], chuyn gia ch?m Casey [name] [name] s?c kh?e c th? th?c hi?n cc xt nghi?m hnh ?nh, Casey [name] h?n nh? ch?p MRI (ch?p c?ng h??ng t?). ?I?U TR? H?u h?t cc tr??ng h?p Casey [name] [name] t? Casey h?t m khng c?n ?i?u tr?Marland Kitchen Ty thu?c vo Casey [name] [name] nhn, chuyn gia ch?m Casey [name] [name] s?c kh?e c th? k [name] s? thu?c nh?t ??nh. N?u Casey [name] [name] c lin quan ??n cc v?n ?? v? v? tr c? th?, chuyn gia ch?m Casey [name] [name] s?c kh?e c th? ?? xu?t cc ??ng tc ho?c ph??ng php ?? kh?c ph?c v?n ?? ny. Trong [name] s? tr??ng h?p hi?m g?p, n?u Casey m?t do [name] s? v?n ?? v? tai trong, b?n c th? c?n ph?u thu?t. H??NG D?N CH?M Casey [name] [name] T?I NH  Lm theo h??ng d?n c?a bc s?Casey [name] [name] li xe.  Trnh v?n hnh my mc n?ng.  Trnh th?c hi?n b?t c? nhi?m v? no c th? gy nguy hi?m cho b?n ho?c nh?ng ng??i khc trong tnh tr?ng Casey [name] [name].  Ni cho  chuyn gia ch?m Casey [name] [name] s?c kh?e bi?t n?u b?n nh?n th?y lo?i thu?c no ? d??ng nh? l Casey [name] [name] nhn gy Casey [name] [name] cho b?n. [name] s? thu?c ???c dng ?? ?i?u tr? tnh tr?ng Casey [name] [name] th?c s? c th? lm cho Casey t?i t? h?n ? [name] s? ng??i. HY NGAY L?P T?C ?I KHM N?U:  Thu?c c v? nh? khng c tc d?ng lm h?t c?n Casey [name] [name] ho?c lm b?nh n?ng thm.  B?n c v?n ?? v? ni chuy?n, ?i b?, ?m y?u ho?c vi?c s? d?ng cnh tay, bn tay hay chn.  B?n b? ?au ??u r?t nhi?u.  Bu?n nn ho?c nn lin t?c ho?c n?ng h?n.  B?n b? thay ??i th? gic.  Thnh vin trong gia ?nh nh?n th?y cc thay ??i hnh vi.  Tnh tr?ng c?a b?n tr? nn n?ng h?n. ??M B?O B?N:  Hi?u cc h??ng d?n ny.  S? theo di tnh tr?ng c?a mnh.  S? yu c?u tr? gip ngay l?p t?c n?u  b?n c?m th?y khng ?? ho?c tnh tr?ng tr?m tr?ng h?n. Document Released: 04/08/2005 Document Revised: 12/09/2012 Casey [name] [name] Patient Information 2015 Casey [name] [name]. This information is not intended to replace advice given to you by your health care provider. Make sure you discuss any questions you have with your health care provider.  Bu?n Nn v Nn (Nausea and Vomiting) Bu?n nn l [name] c?m gic mu?n nn xu?t hi?n tr??c khi nn (i m?a). Nn l [name] ph?n x? trong ? cc ch?t trong d? dy ra kh?i mi?ng b?n. Nn c th? gy ra [name] n??c trong c? th? nghim tr?ng ([name] n??c). Tr? em v ng??i cao tu?i c th? b? [name] n??c nhanh Casey, ??c bi?t n?u h? b? c? tiu ch?y. Bu?n nn v nn l tri?u Casey [name] c?a [name] tnh tr?ng ho?c b?nh. ?i?u quan tr?ng l tm ra Casey [name] [name] nhn gy ra cc tri?u Casey [name]. Casey [name] [name] NHN  Tr?c ti?p kch thch nim m?c d? dy. S? kch thch ny c th? do t?ng l??ng axit (tro ng??c d? dy th?c qu?n), nhi?m trng, ng? ??c th?c ph?m, dng [name] s? lo?i thu?c nh?t ??nh (nh? thu?c Casey [name] vim khng c steroid), s? d?ng r??u ho?c s? d?ng thu?c l.  Tn hi?u t? no. Nh?ng tn hi?u ny c th? do ?au ??u, ti?p xc v?i s?c nng, r?i lo?n ? tai trong, t?ng p l?c trong no  sau ch?n th??ng, nhi?m trng, kh?i u ho?c ch?n ??ng, ?au, kch thch v? [name] c?m xc ho?c cc v?n ?? chuy?n ha.  T?c ngh?n ? ???ng tiu ha (t?c ngh?n ru?t).  Cc b?nh nh? ti?u ???ng, vim gan, v?n ?? v? ti [name], vim ru?t th?a, v?n ?? v? th?n, ung th?, nhi?m khu?n huy?t, cc tri?u Casey [name] khng ?i?n hnh c?a [name] c?n nh?i mu c? tim ho?c r?i lo?n ?n u?ng.  ?i?u tr? n?i khoa nh? ha tr? li?u v x? tr?.  ?ang dng thu?c lm cho b?n ng? (gy m ton thn) trong khi ph?u thu?t. CH?N ?ON Chuyn gia ch?m Casey [name] [name] s?c kh?e c th? yu c?u ti?n hnh cc xt nghi?m n?u v?n ?? khng c?i thi?n sau [name] vi ngy. Cc xt nghi?m c?ng c th? ???c th?c hi?n n?u c cc tri?u Casey [name] n?ng ho?c n?u l do gy bu?n nn v nn khng r rng. Cc xt nghi?m c th? bao g?m:  Xt nghi?m n??c ti?u.  Xt nghi?m mu.  Xt nghi?m phn.  C?y m?u (?? tm b?ng Casey [name] nhi?m trng).  Ch?p X-quang ho?c cc nghin c?u hnh ?nh khc. K?t qu? xt nghi?m c th? gip chuyn gia ch?m Casey [name] [name] s?c kh?e c?a b?n ??a ra quy?t ??nh v? ?i?u tr? ho?c c?n ph?i th?c hi?n thm cc xt nghi?m khc. ?I?U TR? B?n c?n gi? ? tr?ng thi ?? n??c t?t. U?ng th??ng xuyn nh?ng v?i s? l??ng nh?. B?n c th? mu?n u?ng n??c, ?? u?ng th? thao, n??c dng trong, ho?c ?n kem ?ng l?nh ho?c mn trng mi?ng gelatin ?? gi? ?? n??c. Khi b?n ?n, nn ?n ch?m c th? gip ng?n ng?a bu?n nn. Ngoi ra cn c [name] s? thu?c Casey [name] nn c th? gip ng?n ng?a bu?n nn. H??NG D?N CH?M Casey [name] [name] T?I NH  S? d?ng t?t c? thu?c theo ch? d?n c?a chuyn gia ch?m Casey [name] [name] s?c kh?e.  N?u b?n khng thm ?n, khng nn p bu?c mnh ?n. Tuy nhin, b?n ph?i ti?p t?c u?ng n??c.  N?u b?n thm ?n, hy ?n [name] ch? ?? ?n bnh th??ng, tr? khi chuyn gia  ch?m Casey [name] [name] s?c kh?e c?a b?n c ch? d?n khc.  ?n nhi?u lo?i hi?rat [place] ph?c t?p (g?o, la m, khoai ty, bnh m), th?t n?c, s?a chua, tri cy v rau qu?Marland Kitchen  Casey [name] [name] cc lo?i th?c ph?m c hm l??ng ch?t bo cao v Casey kh tiu ha h?n.  U?ng ?? n??c v dung  d?ch ?? n??c ti?u trong ho?c c mu vng nh?t.  N?u b?n b? [name] n??c, hy h?i chuyn gia ch?m Casey [name] [name] s?c kh?e c?a b?n ?? ???c h??ng d?n b n??c c? th?. D?u hi?u [name] n??c c th? bao g?m:  R?t kht.  Mi v mi?ng kh.  Hoa [name].  N??c ti?u x?m mu.  Gi?m t?n su?t v l??ng n??c ti?u.  L? l?n.  Th? ho?c M?ch nhanh. HY NGAY L?P T?C ?I KHM N?U:  C mu ho?c ??m nu (gi?ng nh? b c ph) trong ch?t nn c?a b?n.  Phn b?n c mu ?en ho?c c mu.  B?n b? ?au ??u ho?c c?ng c? nhi?u.  B?n b? l l?n.  B?n b? ?au b?ng n?ng.  B?n b? ?au ng?c ho?c kh th?.  B?n khng ?i ti?u t nh?t [name] l?n m?i 8 gi?.  Da b?n l?nh ho?c l?nh v ?m.  B?n ti?p t?c nn ko di h?n 24 ??n 48 gi?.  B?n b? s?t. ??M B?O B?N:  Hi?u cc h??ng d?n ny.  S? theo di tnh tr?ng c?a mnh.  S? yu c?u tr? gip ngay l?p t?c n?u b?n c?m th?y khng ?? ho?c tnh tr?ng tr?m tr?ng h?n. Document Released: 10/30/2010 Document Revised: 12/09/2012 [facility] Patient Information 2015 [place]. This information is not intended to replace advice given to you by your health care provider. Make sure you discuss any questions you have with your health care provider.  ?au n?a ??u. (Migraine Headache) ?au n?a ??u l [name] c?n ?au nhi v nhi?u ? [name] bn ??u c?a qu v?. [name] c?n ?au n?a ??u c th? ko di t? 30 pht ??n vi ti?ng. Casey [name] [name] NHN.  Casey [name] [name] nhn chnh xc c?a ?au n?a ??u khng ph?i lc no c?ng xc ??nh ???c. Tuy nhin, ?au n?a ??u c th? pht sinh khi cc dy th?n kinh trong no b? kch thch v gi?i phng ra cc ha ch?t gy vim. Hi?n t??ng ny gy ra ?au. [name] s? v?n ?? khc c?ng c th? gy ra ?au n?a ??u, Casey [name] h?n:  R??u.  Ht thu?c l.  C?ng th?ng.  Kinh nguy?t.  Pho mt ?? lu.  Th?c ?n ho?c ?? u?ng c ch?a nitrat, glutamate, aspartame, ho?c tyramine.  Thi?u ng?.  S c la.  Caffeine.  ?i.  G?ng s?c.  [name] m?i.  Thu?c dng ?? ?i?u tr? ?au ng?c (nitroglycerine), vin thu?c trnh [place],  estrogen, v [name] s? thu?c ?i?u tr? huy?t p. D?U HI?U V TRI?U Casey [name]  ?au ? [name] bn ho?c c? hai bn ??u.  ?au t?ng c?n ho?c ?au nhi.  ?au nhi?u lm c?n tr? cc ho?t ??ng hng ngy.  C?n ?au k?ch pht khi c b?t k? ho?t ??ng th? ch?t no.  Bu?n nn, nn m?a, ho?c c? hai.  Casey [name] [name].  ?au khi ti?p xc v?i nh sng chi, ti?ng ?n l?n, ho?c ho?t ??ng.  Nh?y c?m ton thn v?i nh sng chi, ti?ng ?n l?n, ho?c mi. Tr??c khi qu v? b? ?au n?a ??u, qu v? c th? c nh?ng d?u hi?u c?nh bo s?p c c?n ?au n?a ??u (ti?n tri?u). [name] ti?n tri?u  c th? bao g?m:  Nhn th?y nh sng lo ln.  Nhn th?y nh?ng ?i?m sng, qu?ng sng, ho?c cc ???ng ngo?n ngoo.  C th? tr??ng hnh ?ng ho?c nhn m?.  C c?m gic t b ho?c ?au bu?t.  Ni kh.  B? y?u c?. CH?N ?ON  ?au n?a ??u th??ng ???c ch?n ?on d?a vo:  Cc tri?u Casey [name].  Khm th?c th?.  Ch?p CT ho?c MRI ??u qu v?. Cc ki?m tra b?ng hnh ?nh ny khng th? ch?n ?on ???c ?au n?a ??u, nh?ng Casey c th? gip lo?i tr? nh?ng Casey [name] [name] nhn gy ?au ??u khc. ?I?U TR? C th? cho dng thu?c gi?m ?au v Casey [name] bu?n nn. C?ng c th? cho dng thu?c ?? ng?n ng?a ti di?n ?au n?a ??u.  H??NG D?N CH?M [place] T?I NH  Ch? s? d?ng thu?c khng c?n k ??n ho?c thu?c c?n k ??n ?? gi?m ?au ho?c gi?m c?m gic kh ch?u theo ch? d?n c?a chuyn gia ch?m [place] s?c kh?e c?a qu v?. Khng nn dng thu?c m gy nghi?n ko di.  N?m trong [name] phng t?i, yn t?nh khi qu v? b? ?au n?a ??u.  Ghi nh?t k hng ngy ?? tm ra ?i?u g c th? gy cc c?n ?au n?a ??u. Casey [name] h?n, hy ghi ra:  Qu v? ?n v u?ng g.  Qu v? ? ng? bao lu.  B?t k? thay ??i no trong ch? ?? ?n ho?c thu?c men.  H?n ch? s? d?ng r??u.  B? thu?c l, n?u qu v? ht thu?c.  Ng? 7 - 9 ti?ng, ho?c theo khuy?n ngh? c?a chuyn gia ch?m [place] s?c kh?e.  H?n ch? c?ng th?ng.  Gi? cho nh sng d?u nh? n?u nh sng m?nh lm qu v? kh ch?u v lm Casey [name] ?au n?a ??u t?i t? h?n. NGAY L?P T?C ?I KHM  N?U:   C?n ?au n?a ??u c?a qu v? n?ng h?n.  Qu v? b? s?t.  Qu v? b? c?ng c?.  Qu v? b? [name] th? l?c.  Qu v? b? y?u c? ho?c [name] ki?m sot c?.  Qu v? b?t ??u [name] th?ng b?ng ho?c ?i l?i kh kh?n.  Qu v? c?m th?y mu?n ng?t ho?c ng?t.  Qu v? c nh?ng tri?u Casey [name] n?ng khc v?i nh?ng tri?u Casey [name] ban ??u. ??M B?O QU V?:   Hi?u r cc h??ng d?n ny.  S? theo di tnh tr?ng c?a mnh.  S? yu c?u tr? gip ngay l?p t?c n?u qu v? c?m th?y khng kh?e ho?c th?y tr?m tr?ng h?n. Document Released: 04/08/2005 Document Revised: 01/27/2013 [facility] Patient Information 2015 [place], [place]. This information is not intended to replace advice given to you by your health care provider. Make sure you discuss any questions you have with your health care provider.

## 2014-10-04 ENCOUNTER — Ambulatory Visit (INDEPENDENT_AMBULATORY_CARE_PROVIDER_SITE_OTHER): Payer: Self-pay | Admitting: Neurology

## 2014-10-04 ENCOUNTER — Encounter: Payer: Self-pay | Admitting: Neurology

## 2014-10-04 VITALS — BP 123/81 | HR 68 | Temp 97.5°F | Ht 64.5 in | Wt 204.2 lb

## 2014-10-04 DIAGNOSIS — R51 Headache: Secondary | ICD-10-CM

## 2014-10-04 DIAGNOSIS — R519 Headache, unspecified: Secondary | ICD-10-CM

## 2014-10-04 NOTE — Progress Notes (Signed)
Patient needs interpreter, will reschedule

## 2014-10-06 ENCOUNTER — Ambulatory Visit: Payer: 59 | Admitting: Neurology

## 2016-12-17 IMAGING — MR MR HEAD W/O CM
9 of 12 series · 32 of 48 positions shown · non-contrast
Comparison: Prior CT from earlier the same day.

CLINICAL DATA: Initial evaluation for acute headache, dizziness.

EXAM:
MRI HEAD WITHOUT CONTRAST
TECHNIQUE: Multiplanar, multiecho pulse sequences of the brain and surrounding
structures were obtained without intravenous contrast.

[Series 2: FLAIR · sagittal · 5.0mm · 0.47mm/px · 2 of 23 slices shown (1 of 2)]
[im 1/23]
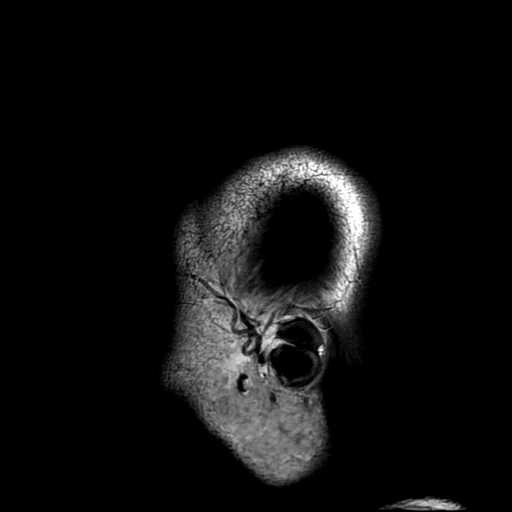
[im 23/23]
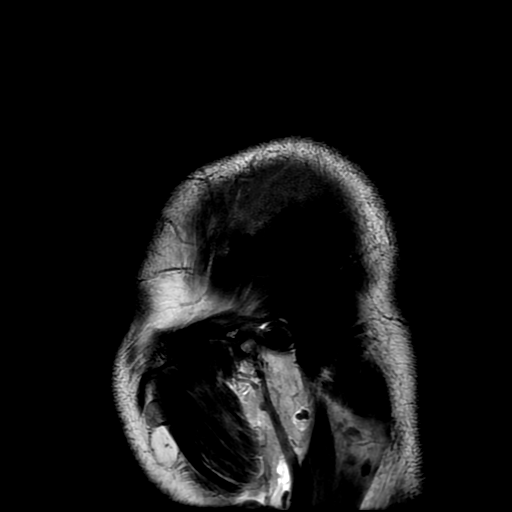

[Series 4: DWI · axial · 3.0mm · 0.94mm/px · z∈[-111,+33]mm · 7 of 100 slices shown (1 of 4)]
[im 1/100]
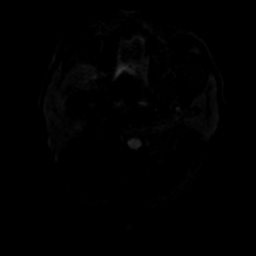
[im 17/100]
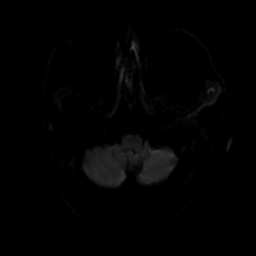
[im 34/100]
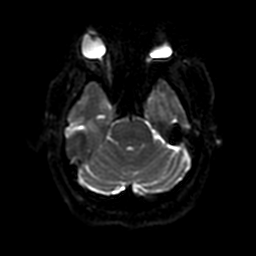
[im 50/100]
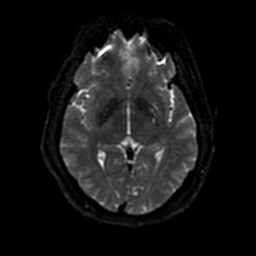
[im 67/100]
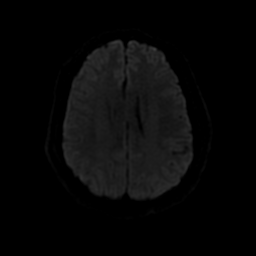
[im 83/100]
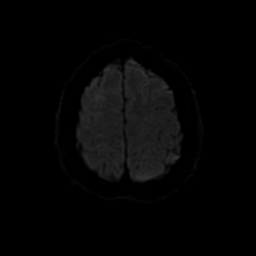
[im 100/100]
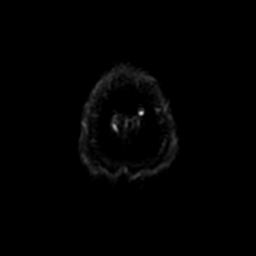

[Series 5: T2 · axial · 5.0mm · 0.47mm/px · z∈[-123,+35]mm · 2 of 28 slices shown (1 of 2)]
[im 1/28]
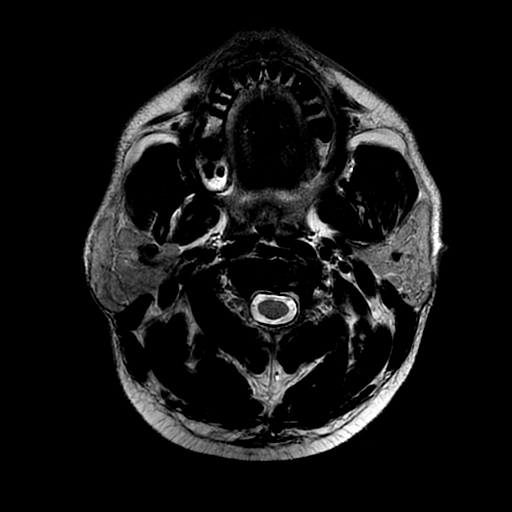
[im 28/28]
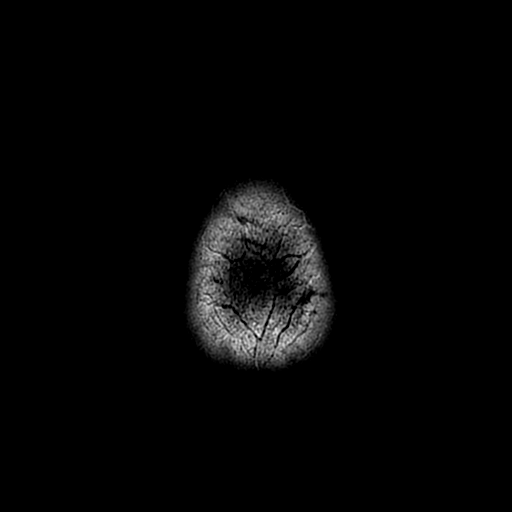

[Series 6: FLAIR · axial · 5.0mm · 0.47mm/px · z∈[-123,+35]mm · 2 of 28 slices shown (2 of 2)]
[im 1/28]
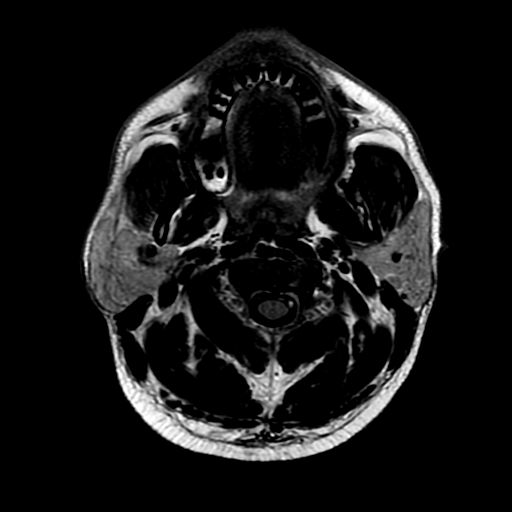
[im 28/28]
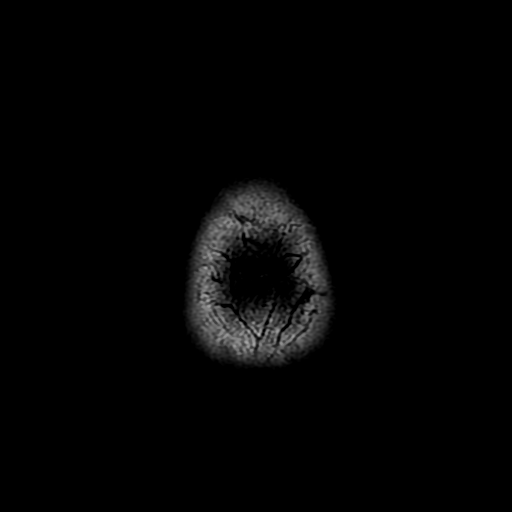

[Series 7: DWI · coronal · 5.0mm · 0.94mm/px · 5 of 74 slices shown (2 of 4)]
[im 1/74]
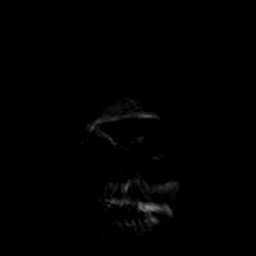
[im 19/74]
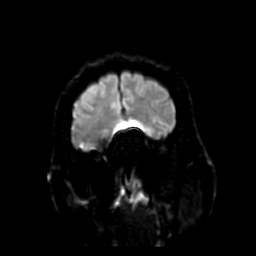
[im 37/74]
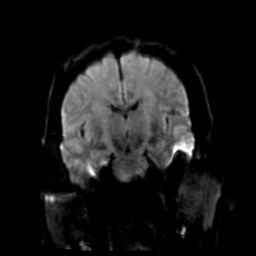
[im 55/74]
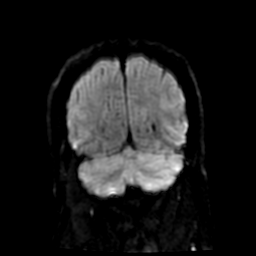
[im 74/74]
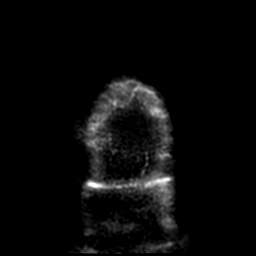

[Series 8: (person_name) · axial · 3.0mm · 0.47mm/px · z∈[-98,-0]mm · 5 of 100 slices shown]
[im 1/100]
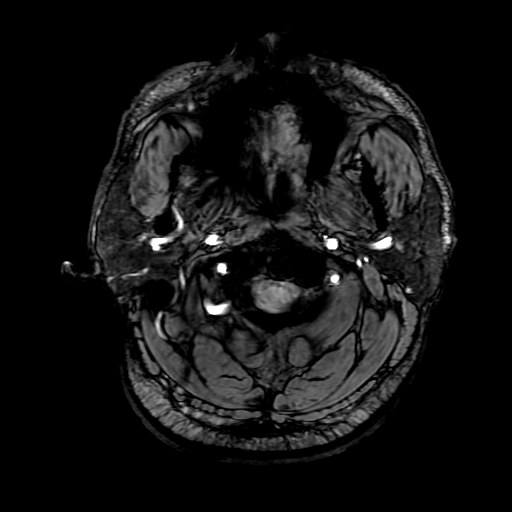
[im 17/100]
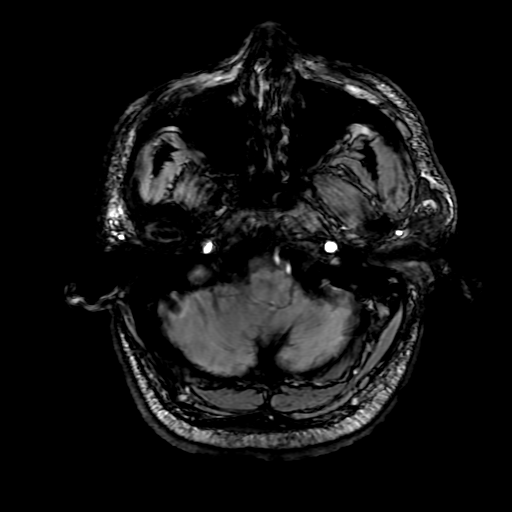
[im 34/100]
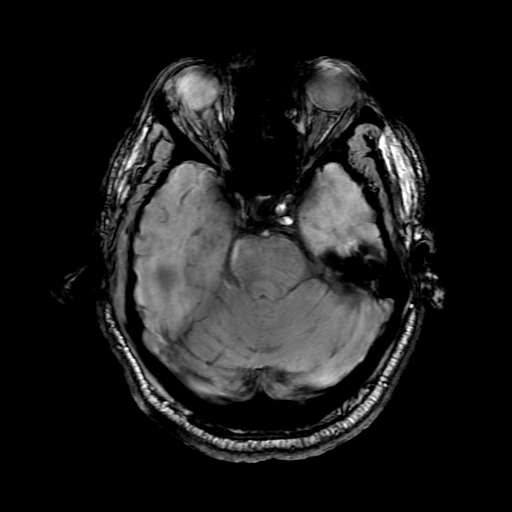
[im 50/100]
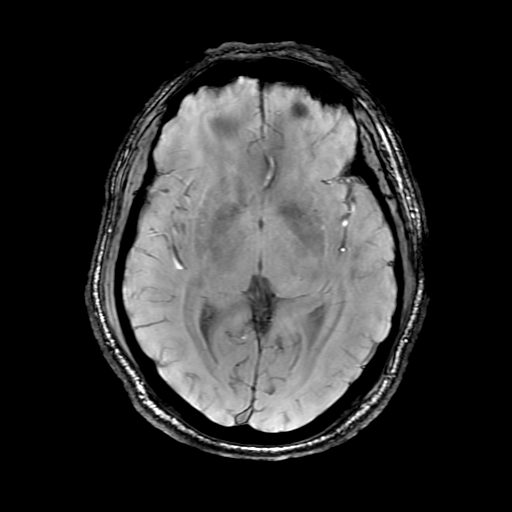
[im 67/100]
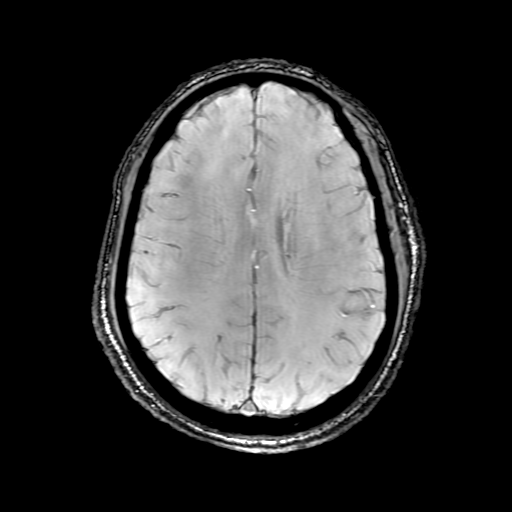

[Series 10: T2 · coronal · 5.0mm · 0.39mm/px · 2 of 29 slices shown (2 of 2)]
[im 1/29]
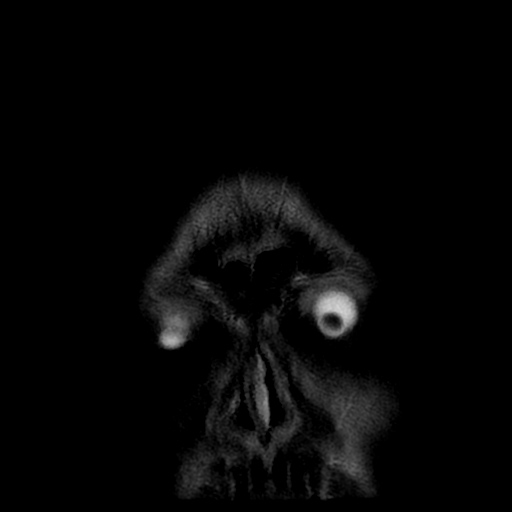
[im 29/29]
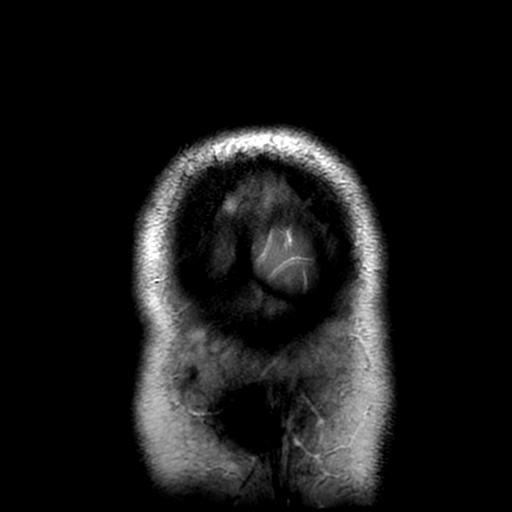

[Series 400: DWI · axial · 3.0mm · 0.94mm/px · z∈[-111,+33]mm · 4 of 50 slices shown (3 of 4)]
[im 1/50]
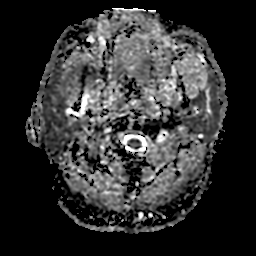
[im 17/50]
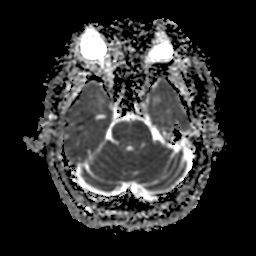
[im 33/50]
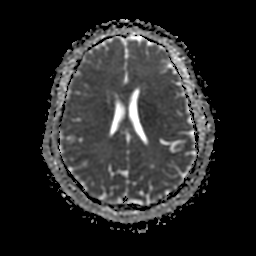
[im 50/50]
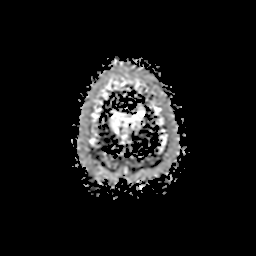

[Series 700: DWI · coronal · 5.0mm · 0.94mm/px · 3 of 37 slices shown (4 of 4)]
[im 1/37]
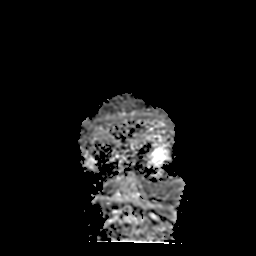
[im 19/37]
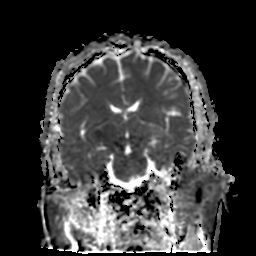
[im 37/37]
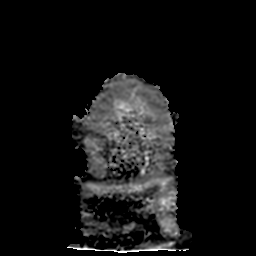

[32 of 48 positions shown; findings below may reference images not displayed]

FINDINGS: The CSF containing spaces are within normal limits for patient age.
Few scattered T2/FLAIR hyperintense foci noted within the
periventricular white matter, nonspecific, but within normal limits
for patient age. No mass lesion, midline shift, or extra-axial fluid
collection. Ventricles are normal in size without evidence of
hydrocephalus.

No diffusion-weighted signal abnormality is identified to suggest
acute intracranial infarct. Gray-white matter differentiation is
maintained. Normal flow voids are seen within the intracranial
vasculature. No intracranial hemorrhage identified.

The cervicomedullary junction is normal. Pituitary gland is within
normal limits. Pituitary stalk is midline. The globes and optic
nerves demonstrate a normal appearance with normal signal intensity.

The bone marrow signal intensity is normal. Calvarium is intact.
Visualized upper cervical spine is within normal limits.

Scalp soft tissues are unremarkable.

Mild scattered opacity present within the ethmoidal air cells.
Scattered fluid present within the mastoid air cells bilaterally,
left greater than right.
IMPRESSION: 1. Normal brain MRI with no acute intracranial process identified.
2. Small bilateral mastoid effusions, left greater than right.
